# Patient Record
Sex: Male | Born: 1995
Health system: Southern US, Community
[De-identification: ages and names within clinical notes are randomized; demographics above are authoritative.]

## PROBLEM LIST (undated history)

## (undated) DIAGNOSIS — Z872 Personal history of diseases of the skin and subcutaneous tissue: Secondary | ICD-10-CM

---
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
## 2015-04-12 IMAGING — CT CT ANKLE*R* W/O CM
1 of 6 series · 3 of 14 positions shown, 4 images · non-contrast
Comparison: None.

CLINICAL DATA: Skiing accident 6 days ago. Right ankle pain,
weakness and decreased range of motion.

EXAM:
CT OF THE RIGHT ANKLE WITHOUT CONTRAST
TECHNIQUE: Multidetector CT imaging was performed according to the standard
protocol. Multiplanar CT image reconstructions were also generated.

[Series 2: ankle/foot bone · axial · 0.28mm/px · z∈[-48,+132]mm · 3 of 73 slices shown, 4 images]
[im 1/73  soft-tissue]
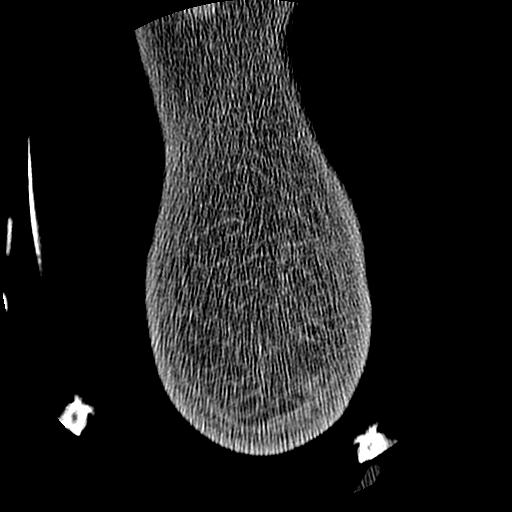
[im 1/73  bone]
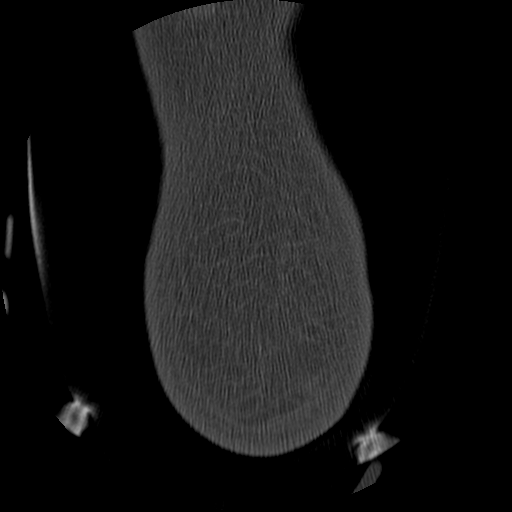
[im 37/73  bone]
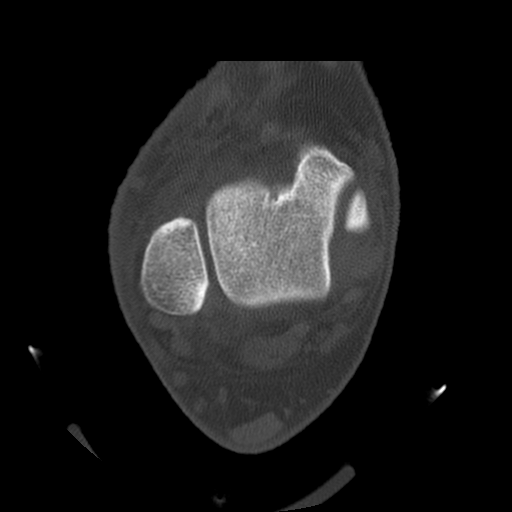
[im 73/73  bone]
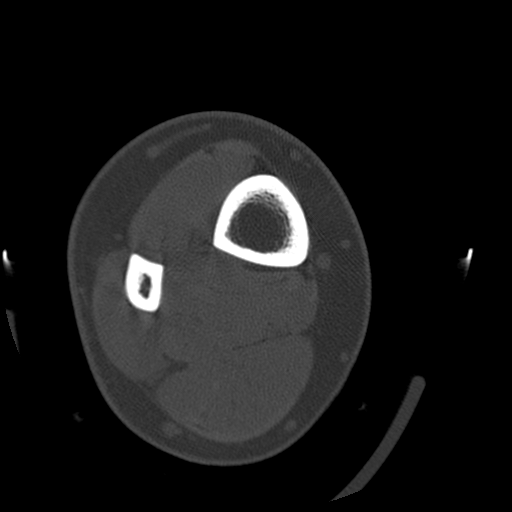

[3 of 14 positions shown; findings below may reference images not displayed]

FINDINGS: No fracture is identified. There is no focal bony lesion. There is
no tibiotalar joint effusion. No arthropathy is identified about the
ankle or hindfoot. No fluid collection or mass is seen. All
visualized muscles and tendons have a normal CT appearance. No
ligament tear is identified.
IMPRESSION: Negative CT scan right ankle.

## 2020-07-16 DIAGNOSIS — U071 COVID-19: Secondary | ICD-10-CM

## 2020-07-16 HISTORY — DX: COVID-19: U07.1

## 2020-08-03 ENCOUNTER — Emergency Department: Admit: 2020-08-03 | Discharge status: Absent without leave | Payer: Self-pay

## 2020-08-03 ENCOUNTER — Emergency Department
Admission: EM | Admit: 2020-08-03 | Discharge: 2020-08-03 | Disposition: A | Discharge status: Home / Self Care | Payer: Self-pay | Source: Home / Self Care

## 2020-08-03 ENCOUNTER — Other Ambulatory Visit: Payer: Self-pay

## 2020-08-03 ENCOUNTER — Encounter: Payer: Self-pay | Admitting: Emergency Medicine

## 2020-08-03 DIAGNOSIS — T148XXA Other injury of unspecified body region, initial encounter: Secondary | ICD-10-CM | POA: Diagnosis not present

## 2020-08-03 DIAGNOSIS — S300XXA Contusion of lower back and pelvis, initial encounter: Secondary | ICD-10-CM

## 2020-08-03 HISTORY — DX: Personal history of diseases of the skin and subcutaneous tissue: Z87.2

## 2020-08-03 LAB — POCT CBC W AUTO DIFF (K'VILLE URGENT CARE)

## 2020-08-03 NOTE — ED Triage Notes (Addendum)
Pilonidal cyst - had one 5 years ago - drained & packed - resolved  Pt noticed pain and bruising earlier this week & believes cyst has returned Denies fever  Moderna vaccine in March  COVID + 2 weeks ago

## 2020-08-03 NOTE — Discharge Instructions (Signed)
°  You can try warm compresses or warm soaks with epson salt 2-3 times daily for 10-15 minutest at a time. Monitor the area closely. Follow up later this week if symptoms not improving. Follow up sooner if you develop worsening pain, swelling, fever, redness or warmth to the area, trouble going to the bathroom, or other new concerning symptoms develop.

## 2020-08-03 NOTE — ED Provider Notes (Signed)
[facility]    CSN: [id number] Arrival date & time: 08/03/20  0949      History   Chief Complaint Chief Complaint  Patient presents with  . Cyst    HPI Alejandro Dominguez is a 25 y.o. male.   HPI  Alejandro Dominguez is a 25 y.o. male presenting to UC with c/o 2-3 days of mildly aching soreness over his tailbone and noticed bruising to the area. Does not recall any recent falls or injury. He does sit at lot for work but tries to take breaks and walk around. Hx of infected pilonidal cyst in college about 5 years ago that required drainage and packing at that time.  Denies fever, chills, n/v/d. No other unusual bruising.    Past Medical History:  Diagnosis Date  . COVID-19 07/16/2020  . H/O pilonidal cyst     There are no problems to display for this patient.   History reviewed. No pertinent surgical history.     Home Medications    Prior to Admission medications   Medication Sig Start Date End Date Taking? Authorizing Provider  fexofenadine (ALLEGRA ODT) 30 MG disintegrating tablet Take by mouth.    [provider]  Multiple Vitamin (MULTIVITAMIN) capsule Take 1 capsule by mouth daily.    [provider]    Family History Family History  Problem Relation Age of Onset  . Healthy Mother   . Healthy Father   . Healthy Sister     Social History Social History   Tobacco Use  . Smoking status: Current Some Day Smoker  . Smokeless tobacco: Never Used  . Tobacco comment: marijuana  Vaping Use  . Vaping Use: Some days  . Substances: Nicotine  Substance Use Topics  . Alcohol use: Yes    Alcohol/week: 4.0 standard drinks    Types: 4 Cans of beer per week  . Drug use: Yes    Types: Marijuana    Comment: 0.75gm/weekly     Allergies   Patient has no known allergies.   Review of Systems Review of Systems  Constitutional: Negative for chills and fever.  Gastrointestinal: Negative for abdominal pain, diarrhea, nausea and  vomiting.  Musculoskeletal: Positive for back pain ( tailbone). Negative for myalgias.  Skin: Positive for color change. Negative for wound.  Neurological: Negative for weakness and numbness.     Physical Exam Triage Vital Signs ED Triage Vitals  Enc Vitals Group     BP 08/03/20 1002 129/78     Pulse Rate 08/03/20 1002 72     Resp 08/03/20 1002 17     Temp 08/03/20 1002 98.8 F (37.1 C)     Temp Source 08/03/20 1002 Oral     SpO2 08/03/20 1002 95 %     Weight 08/03/20 1008 223 lb (101.2 kg)     Height 08/03/20 1008 6\' 4"  (1.93 m)     Head Circumference --      Peak Flow --      Pain Score 08/03/20 1008 2     Pain Loc --      Pain Edu? --      Excl. in GC? --    No data found.  Updated Vital Signs BP 129/78 (BP Location: Right Arm)   Pulse 72   Temp 98.8 F (37.1 C) (Oral)   Resp 17   Ht 6\' 4"  (1.93 m)   Wt 223 lb (101.2 kg)   SpO2 95%   BMI 27.14 kg/m  Visual Acuity Right Eye Distance:   Left Eye Distance:   Bilateral Distance:    Right Eye Near:   Left Eye Near:    Bilateral Near:     Physical Exam Vitals and nursing note reviewed. Exam conducted with a chaperone present.  Constitutional:      Appearance: Normal appearance. He is well-developed and well-nourished.  HENT:     Head: Normocephalic and atraumatic.  Eyes:     Extraocular Movements: EOM normal.  Cardiovascular:     Rate and Rhythm: Normal rate.  Pulmonary:     Effort: Pulmonary effort is normal.  Musculoskeletal:        General: Normal range of motion.     Cervical back: Normal range of motion.  Skin:    General: Skin is warm and dry.     Findings: Bruising present.       Neurological:     Mental Status: He is alert and oriented to person, place, and time.  Psychiatric:        Mood and Affect: Mood and affect normal.        Behavior: Behavior normal.      UC Treatments / Results  Labs (all labs ordered are listed, but only abnormal results are displayed) Labs Reviewed   POCT CBC W AUTO DIFF ([facility])    EKG   Radiology No results found.  Procedures Procedures (including critical care time)  Medications Ordered in UC Medications - No data to display  Initial Impression / Assessment and Plan / UC Course  I have reviewed the triage vital signs and the nursing notes.  Pertinent labs & imaging results that were available during my care of the patient were reviewed by me and considered in my medical decision making (see chart for details).     Bruising over sacral area, just superior to gluteal cleft. No evidence of infected cyst or abscess at this time. Pt does not recall any recent falls or injury No radiation of pain or numbness in legs or groin. CBC:  Encouraged to monitor, f/u if symptoms worsening or new symptoms develop  Final Clinical Impressions(s) / UC Diagnoses   Final diagnoses:  Bruising  Contusion of buttock, initial encounter     Discharge Instructions      You can try warm compresses or warm soaks with epson salt 2-3 times daily for 10-15 minutest at a time. Monitor the area closely. Follow up later this week if symptoms not improving. Follow up sooner if you develop worsening pain, swelling, fever, redness or warmth to the area, trouble going to the bathroom, or other new concerning symptoms develop.     ED Prescriptions    None     PDMP not reviewed this encounter.   Lurene Shadow, PA-C 08/03/20 1140

## 2023-08-02 ENCOUNTER — Ambulatory Visit: Payer: 59 | Admitting: Medical
